# Patient Record
Sex: Male | Born: 1965 | Race: White | Hispanic: No | Marital: Married | Smoking: Never smoker
Health system: Southern US, Community
[De-identification: ages and names within clinical notes are randomized; demographics above are authoritative.]

## PROBLEM LIST (undated history)

## (undated) DIAGNOSIS — J309 Allergic rhinitis, unspecified: Secondary | ICD-10-CM

## (undated) DIAGNOSIS — Z9989 Dependence on other enabling machines and devices: Secondary | ICD-10-CM

## (undated) DIAGNOSIS — R7989 Other specified abnormal findings of blood chemistry: Secondary | ICD-10-CM

## (undated) DIAGNOSIS — E7800 Pure hypercholesterolemia, unspecified: Secondary | ICD-10-CM

## (undated) DIAGNOSIS — G4733 Obstructive sleep apnea (adult) (pediatric): Secondary | ICD-10-CM

## (undated) DIAGNOSIS — K5790 Diverticulosis of intestine, part unspecified, without perforation or abscess without bleeding: Secondary | ICD-10-CM

## (undated) HISTORY — DX: Obstructive sleep apnea (adult) (pediatric): G47.33

## (undated) HISTORY — DX: Other specified abnormal findings of blood chemistry: R79.89

## (undated) HISTORY — PX: HAND SURGERY: SHX662

## (undated) HISTORY — DX: Dependence on other enabling machines and devices: Z99.89

## (undated) HISTORY — DX: Allergic rhinitis, unspecified: J30.9

## (undated) HISTORY — PX: TONSILLECTOMY: SUR1361

---

## 2005-05-02 ENCOUNTER — Observation Stay (HOSPITAL_COMMUNITY): Admission: EM | Admit: 2005-05-02 | Discharge: 2005-05-02 | Payer: Self-pay | Admitting: Emergency Medicine

## 2005-09-24 ENCOUNTER — Ambulatory Visit (HOSPITAL_COMMUNITY): Admission: RE | Admit: 2005-09-24 | Discharge: 2005-09-24 | Payer: Self-pay | Admitting: Gastroenterology

## 2009-12-28 ENCOUNTER — Ambulatory Visit (HOSPITAL_BASED_OUTPATIENT_CLINIC_OR_DEPARTMENT_OTHER): Admission: RE | Admit: 2009-12-28 | Discharge: 2009-12-28 | Payer: Self-pay | Admitting: Otolaryngology

## 2010-01-04 ENCOUNTER — Ambulatory Visit: Payer: Self-pay | Admitting: Internal Medicine

## 2011-04-16 NOTE — Consult Note (Signed)
NAMESAHEJ, [name]                ACCOUNT NO.:  [id number]   MEDICAL RECORD NO.:  [id number]          PATIENT TYPE:  INP   LOCATION:  1830                         FACILITY:  [facility]   PHYSICIAN:  Artist Pais. Weingold, M.D.DATE OF BIRTH:  [birth year]   DATE OF CONSULTATION:  05/02/2005  DATE OF DISCHARGE:                                   CONSULTATION   REQUESTING PHYSICIAN:  Markham Jordan L. Effie Shy, M.D.   REASON FOR CONSULTATION:  Jeffery Branch is a 45 year old right-handed  dominant male who got his nondominant left hand caught in a hedge trimmer  and presents today with obvious grossly contaminated open wound to the  dorsal aspect of his long and index finger nondominant left hand.  He is 45  years old.  He has no known drug allergies, no current medications, no  recent hospitalizations or surgery.   FAMILY HISTORY:  Noncontributory.   SOCIAL HISTORY:  Noncontributory.   PHYSICAL EXAMINATION:  GENERAL:  Well-developed, well-nourished male,  pleasant, alert and oriented times three.  EXTREMITIES:  Examination of his left hand reveals an obvious open injury  times two, one over the index proximal interphalangeal joint, one over the  long finger dorsally from the proximal interphalangeal joint all the way to  the nail plate.  He has weakness in extension. Volarly he is intact.  He has  exposed middle phalangeal bone on the long finger and exposed proximal  interphalangeal joint on his index finger.   X-rays show a fracture of the middle phalanx on his long finger.   IMPRESSION:  A 45 year old male, right hand dominant with a grossly  contaminated complex injury to the dorsal aspect of his nondominant left  index and long finger.   RECOMMENDATIONS:  He needs to go to the operating room for irrigation and  debridement and repair as necessary, left index and long finger.       MAW/MEDQ  D:  05/02/2005  T:  05/03/2005  Job:  [id number]   cc:   Markham Jordan L. Effie Shy, M.D.  [address]  [place] [address]  Fax: [phone]

## 2011-04-16 NOTE — Op Note (Signed)
NAMEEPIFANIO, LABRADOR                ACCOUNT NO.:  [id number]   MEDICAL RECORD NO.:  [id number]          PATIENT TYPE:  AMB   LOCATION:  ENDO                         FACILITY:  [facility]   PHYSICIAN:  John C. Madilyn Fireman, M.D.    DATE OF BIRTH:  06/24/1966   DATE OF PROCEDURE:  09/24/2005  DATE OF DISCHARGE:                                 OPERATIVE REPORT   PROCEDURE:  Colonoscopy.   INDICATIONS FOR PROCEDURE:  Family history of colon polyps in two first-  degree relatives as well as intermittent rectal bleeding.   PROCEDURE:  The patient was placed in the left lateral decubitus position  and placed on the pulse monitor with continuous low-flow oxygen delivered by  nasal cannula.  He was sedated with 75 mcg IV fentanyl and 9 mg IV Versed.  The Olympus video colonoscope was inserted into the rectum and advanced to  the cecum, confirmed by transillumination at McBurney's point and  visualization of the ileocecal valve and appendiceal orifice.  The prep was  excellent.  The cecum, ascending, transverse, and descending colon all  appeared normal with no masses, polyps, diverticula, or other mucosal  abnormalities.  Within the descending and sigmoid colon, there was seen a  few scattered diverticula and no other abnormalities.  The rectum appeared  normal, and retroflexed view of the anus revealed no obvious internal  hemorrhoids.  The scope was then withdrawn, and the patient returned to the  recovery room in stable condition.  He tolerated the procedure well, and  there were no immediate complications.   IMPRESSION:  Diverticulosis, otherwise normal study.   PLAN:  Repeat colonoscopy in 10 years at age 70.           ______________________________  Everardo All. Madilyn Fireman, M.D.     JCH/MEDQ  D:  09/24/2005  T:  09/24/2005  Job:  [id number]   cc:   Sigmund Hazel, M.D.  Fax: [phone]

## 2011-04-16 NOTE — Op Note (Signed)
Jeffery Branch, Jeffery Branch                ACCOUNT NO.:  [id number]   MEDICAL RECORD NO.:  [id number]          PATIENT TYPE:  INP   LOCATION:  1830                         FACILITY:  [facility]   PHYSICIAN:  Artist Pais. Weingold, M.D.DATE OF BIRTH:  1966/02/05   DATE OF PROCEDURE:  05/02/2005  DATE OF DISCHARGE:                                 OPERATIVE REPORT   PREOPERATIVE DIAGNOSIS:  Left index and left long open injuries.   POSTOPERATIVE DIAGNOSIS:  Left index and left long open injuries.   OPERATION PERFORMED:  1.  Left long finger irrigation and debridement and open treatment middle      phalangeal fracture, extensor tendon repair.  2.  Irrigation and debridement and repair of extensor tendon, left index      finger.   SURGEON:  Artist Pais. Mina Marble, M.D.   ASSISTANT:  None.   ANESTHESIA:  General.   TOURNIQUET TIME:  48 minutes.   COMPLICATIONS:  None.   DRAINS:  None.   DESCRIPTION OF PROCEDURE:  The patient was taken to the operating room and  after the induction of adequate general anesthesia was prepped and draped in  the usual sterile fashion.  An Esmarch was used to exsanguinate the limb.  The tourniquet was inflated to 250 mmHg.  At this point the left hand was  approached dorsally.  There was gross contamination of the long and index  fingers with severely complicated lacerations.  These were debrided of gross  contamination including dirt and grass with 2 L of normal saline.  At this  point the open fracture of the middle phalanx and long finger was debrided  and then the extensor tendon was repaired using 4-0 Ethibond in combination  of horizontal mattress and locked epitendinous sutures.  This included the  distal insertion and the area of the triangular ligament over the PIP joint.  The skin was then carefully reconstructed using 4-0 nylon in multiple simple  sutures to cover the extensor tendon and fracture site.  The index finger  PIP joint was then opened,  irrigated and  extensor tendon over the PIP joint was repaired with 4-0 nylon.  At this  point sterile dressings with Xeroform, 4 x 4s, compression wrap and a volar  splint with MP, PIP and DIP joints in extension of the index and long finger  of the left hand was applied.  The patient tolerated the procedure well and  went to recovery room in stable fashion.       MAW/MEDQ  D:  05/02/2005  T:  05/03/2005  Job:  [id number]

## 2011-05-13 ENCOUNTER — Emergency Department (HOSPITAL_COMMUNITY)
Admission: EM | Admit: 2011-05-13 | Discharge: 2011-05-13 | Disposition: A | Discharge status: Home / Self Care | Payer: No Typology Code available for payment source | Attending: Emergency Medicine | Admitting: Emergency Medicine

## 2011-05-13 ENCOUNTER — Emergency Department (HOSPITAL_COMMUNITY): Payer: No Typology Code available for payment source

## 2011-05-13 DIAGNOSIS — M545 Low back pain, unspecified: Secondary | ICD-10-CM | POA: Insufficient documentation

## 2011-05-13 DIAGNOSIS — Z79899 Other long term (current) drug therapy: Secondary | ICD-10-CM | POA: Insufficient documentation

## 2011-05-13 DIAGNOSIS — R209 Unspecified disturbances of skin sensation: Secondary | ICD-10-CM | POA: Insufficient documentation

## 2011-05-13 DIAGNOSIS — E785 Hyperlipidemia, unspecified: Secondary | ICD-10-CM | POA: Insufficient documentation

## 2011-05-13 DIAGNOSIS — R51 Headache: Secondary | ICD-10-CM | POA: Insufficient documentation

## 2011-05-13 DIAGNOSIS — M542 Cervicalgia: Secondary | ICD-10-CM | POA: Insufficient documentation

## 2011-05-13 DIAGNOSIS — H538 Other visual disturbances: Secondary | ICD-10-CM | POA: Insufficient documentation

## 2013-08-14 ENCOUNTER — Ambulatory Visit (INDEPENDENT_AMBULATORY_CARE_PROVIDER_SITE_OTHER): Payer: Managed Care, Other (non HMO) | Admitting: General Surgery

## 2014-03-20 ENCOUNTER — Encounter (INDEPENDENT_AMBULATORY_CARE_PROVIDER_SITE_OTHER): Payer: Self-pay

## 2016-07-17 ENCOUNTER — Encounter (HOSPITAL_COMMUNITY): Payer: Self-pay

## 2016-07-17 ENCOUNTER — Emergency Department (HOSPITAL_COMMUNITY): Payer: Managed Care, Other (non HMO)

## 2016-07-17 ENCOUNTER — Emergency Department (HOSPITAL_COMMUNITY)
Admission: EM | Admit: 2016-07-17 | Discharge: 2016-07-17 | Disposition: A | Discharge status: Home / Self Care | Payer: Managed Care, Other (non HMO) | Attending: Emergency Medicine | Admitting: Emergency Medicine

## 2016-07-17 DIAGNOSIS — R1031 Right lower quadrant pain: Secondary | ICD-10-CM | POA: Diagnosis present

## 2016-07-17 DIAGNOSIS — Z79899 Other long term (current) drug therapy: Secondary | ICD-10-CM | POA: Diagnosis not present

## 2016-07-17 DIAGNOSIS — K5790 Diverticulosis of intestine, part unspecified, without perforation or abscess without bleeding: Secondary | ICD-10-CM | POA: Diagnosis not present

## 2016-07-17 DIAGNOSIS — K5793 Diverticulitis of intestine, part unspecified, without perforation or abscess with bleeding: Secondary | ICD-10-CM

## 2016-07-17 HISTORY — DX: Pure hypercholesterolemia, unspecified: E78.00

## 2016-07-17 HISTORY — DX: Diverticulosis of intestine, part unspecified, without perforation or abscess without bleeding: K57.90

## 2016-07-17 LAB — URINALYSIS, ROUTINE W REFLEX MICROSCOPIC
BILIRUBIN URINE: NEGATIVE
Glucose, UA: NEGATIVE mg/dL
Hgb urine dipstick: NEGATIVE
KETONES UR: NEGATIVE mg/dL
Leukocytes, UA: NEGATIVE
NITRITE: NEGATIVE
PROTEIN: NEGATIVE mg/dL
SPECIFIC GRAVITY, URINE: 1.027 (ref 1.005–1.030)
pH: 6 (ref 5.0–8.0)

## 2016-07-17 LAB — CBC WITH DIFFERENTIAL/PLATELET
Basophils Absolute: 0 10*3/uL (ref 0.0–0.1)
Basophils Relative: 0 %
EOS PCT: 0 %
Eosinophils Absolute: 0 10*3/uL (ref 0.0–0.7)
HCT: 43.6 % (ref 39.0–52.0)
Hemoglobin: 15.1 g/dL (ref 13.0–17.0)
LYMPHS ABS: 1.2 10*3/uL (ref 0.7–4.0)
LYMPHS PCT: 9 %
MCH: 29.8 pg (ref 26.0–34.0)
MCHC: 34.6 g/dL (ref 30.0–36.0)
MCV: 86.2 fL (ref 78.0–100.0)
MONO ABS: 1.1 10*3/uL — AB (ref 0.1–1.0)
MONOS PCT: 9 %
NEUTROS PCT: 82 %
Neutro Abs: 10.4 10*3/uL — ABNORMAL HIGH (ref 1.7–7.7)
PLATELETS: 210 10*3/uL (ref 150–400)
RBC: 5.06 MIL/uL (ref 4.22–5.81)
RDW: 13.2 % (ref 11.5–15.5)
WBC: 12.7 10*3/uL — AB (ref 4.0–10.5)

## 2016-07-17 LAB — COMPREHENSIVE METABOLIC PANEL
ALT: 20 U/L (ref 17–63)
AST: 16 U/L (ref 15–41)
Albumin: 4.3 g/dL (ref 3.5–5.0)
Alkaline Phosphatase: 52 U/L (ref 38–126)
Anion gap: 9 (ref 5–15)
BUN: 16 mg/dL (ref 6–20)
CALCIUM: 9.3 mg/dL (ref 8.9–10.3)
CHLORIDE: 107 mmol/L (ref 101–111)
CO2: 23 mmol/L (ref 22–32)
CREATININE: 0.94 mg/dL (ref 0.61–1.24)
Glucose, Bld: 107 mg/dL — ABNORMAL HIGH (ref 65–99)
Potassium: 3.9 mmol/L (ref 3.5–5.1)
Sodium: 139 mmol/L (ref 135–145)
Total Bilirubin: 1.2 mg/dL (ref 0.3–1.2)
Total Protein: 7.8 g/dL (ref 6.5–8.1)

## 2016-07-17 LAB — LIPASE, BLOOD: LIPASE: 23 U/L (ref 11–51)

## 2016-07-17 MED ORDER — IOPAMIDOL (ISOVUE-300) INJECTION 61%
100.0000 mL | Freq: Once | INTRAVENOUS | Status: AC | PRN
  Administered 2016-07-17: 100 mL via INTRAVENOUS

## 2016-07-17 MED ORDER — METRONIDAZOLE 500 MG PO TABS: 500.0000 mg | ORAL_TABLET | Freq: Three times a day (TID) | ORAL | 0 refills | Status: DC

## 2016-07-17 MED ORDER — SODIUM CHLORIDE 0.9 % IV BOLUS (SEPSIS)
1000.0000 mL | Freq: Once | INTRAVENOUS | Status: AC
  Administered 2016-07-17: 1000 mL via INTRAVENOUS

## 2016-07-17 MED ORDER — METRONIDAZOLE 500 MG PO TABS
500.0000 mg | ORAL_TABLET | Freq: Once | ORAL | Status: AC
  Administered 2016-07-17: 500 mg via ORAL
  Filled 2016-07-17: qty 1

## 2016-07-17 MED ORDER — CIPROFLOXACIN HCL 500 MG PO TABS: 500.0000 mg | ORAL_TABLET | Freq: Two times a day (BID) | ORAL | 0 refills | Status: DC

## 2016-07-17 MED ORDER — CIPROFLOXACIN HCL 500 MG PO TABS
500.0000 mg | ORAL_TABLET | Freq: Once | ORAL | Status: AC
  Administered 2016-07-17: 500 mg via ORAL
  Filled 2016-07-17: qty 1

## 2016-07-17 MED ORDER — ONDANSETRON HCL 4 MG/2ML IJ SOLN
4.0000 mg | Freq: Once | INTRAMUSCULAR | Status: AC
  Administered 2016-07-17: 4 mg via INTRAVENOUS
  Filled 2016-07-17: qty 2

## 2016-07-17 NOTE — ED Notes (Signed)
Pt vitals taken while pt expressing frustration.

## 2016-07-17 NOTE — Discharge Instructions (Signed)
Take cipro and flagyl as prescribed.   Take tylenol, motrin for pain.   See your doctor.   Return to ER if you have severe pain, vomiting, fevers.

## 2016-07-17 NOTE — ED Triage Notes (Addendum)
PT C/O RLQ PAIN WITH NAUSEA, AND COLD SWEATS SINCE TUE. PT WAS SEEN BY HIS PCP TODAY, AND SENT HERE FOR POSSIBLE APPENDICITIS.

## 2016-07-17 NOTE — ED Provider Notes (Signed)
[facility]-EMERGENCY DEPT Provider Note   CSN: [id number] Arrival date & time: 07/17/16  1238     History   Chief Complaint Chief Complaint  Patient presents with  . Abdominal Pain    RLQ    HPI Jeffery KeelerRees Gatt is a 50 y.o. male history of diverticulitis here presenting with right lower quadrant pain. Intermittent periumbilical and right lower quadrant pain For the last 4 days. Yesterday he became more nauseated and had a subjective fever. Patient denies any urinary symptoms or diarrhea or constipation. Patient went to see primary care doctor today and sent here for r/o appendicitis. Hx of diverticulitis previously on colonoscopy.    The history is provided by the patient.    Past Medical History:  Diagnosis Date  . Diverticulitis   . Diverticulosis   . High cholesterol     There are no active problems to display for this patient.   Past Surgical History:  Procedure Laterality Date  . HAND SURGERY     LEFT  . TONSILLECTOMY         Home Medications    Prior to Admission medications   Medication Sig Start Date End Date Taking? Authorizing Provider  ibuprofen (ADVIL,MOTRIN) 200 MG tablet Take 800 mg by mouth every 6 (six) hours as needed for fever.   Yes Historical Provider, MD  venlafaxine XR (EFFEXOR-XR) 75 MG 24 hr capsule TAKE ONE CAPSULE BY MOUTH DAILY WITH FOOD 06/16/16  Yes Historical Provider, MD  VIAGRA 100 MG tablet TAKE 1/2-1 TABLET BY MOUTH AS NEEDED ONCE A DAY 06/28/16  Yes Historical Provider, MD    Family History History reviewed. No pertinent family history.  Social History Social History  Substance Use Topics  . Smoking status: Never Smoker  . Smokeless tobacco: Never Used  . Alcohol use Yes     Comment: OCCASIONAL     Allergies   Penicillins   Review of Systems Review of Systems  Gastrointestinal: Positive for abdominal pain.  All other systems reviewed and are negative.    Physical Exam Updated Vital Signs BP 128/72 (BP Location: Right  Arm)   Pulse 81   Temp 98.7 F (37.1 C) (Oral)   Resp 16   Ht 6\' 1"  (1.854 m)   Wt 233 lb (105.7 kg)   SpO2 100%   BMI 30.74 kg/m   Physical Exam  Constitutional: He is oriented to person, place, and time. He appears well-developed and well-nourished.  HENT:  Head: Normocephalic.  Eyes: EOM are normal. Pupils are equal, round, and reactive to light.  Neck: Normal range of motion.  Cardiovascular: Normal rate, regular rhythm and normal heart sounds.   Pulmonary/Chest: Effort normal and breath sounds normal. No respiratory distress. He has no wheezes. He has no rales.  Abdominal: Soft. Bowel sounds are normal.  Mild RLQ and periumbilical tenderness, mild guarding   Genitourinary:  Genitourinary Comments: No testicular tenderness, good cremasteric reflex   Musculoskeletal: Normal range of motion.  Neurological: He is alert and oriented to person, place, and time.  Skin: Skin is warm.  Psychiatric: He has a normal mood and affect.  Nursing note and vitals reviewed.    ED Treatments / Results  Labs (all labs ordered are listed, but only abnormal results are displayed) Labs Reviewed  CBC WITH DIFFERENTIAL/PLATELET - Abnormal; Notable for the following:       Result Value   WBC 12.7 (*)    Neutro Abs 10.4 (*)    Monocytes Absolute 1.1 (*)  All other components within normal limits  COMPREHENSIVE METABOLIC PANEL - Abnormal; Notable for the following:    Glucose, Bld 107 (*)    All other components within normal limits  URINALYSIS, ROUTINE W REFLEX MICROSCOPIC (NOT AT [facility]) - Abnormal; Notable for the following:    Color, Urine AMBER (*)    All other components within normal limits  LIPASE, BLOOD    EKG  EKG Interpretation None       Radiology Ct Abdomen Pelvis W Contrast  Result Date: 07/17/2016 CLINICAL DATA:  History of diverticulitis and diverticulosis.  Pain. EXAM: CT ABDOMEN AND PELVIS WITH CONTRAST TECHNIQUE: Multidetector CT imaging of the abdomen and  pelvis was performed using the standard protocol following bolus administration of intravenous contrast. CONTRAST:  ISOVUE-300 IOPAMIDOL (ISOVUE-300) INJECTION 61% COMPARISON:  None. FINDINGS: Normal lung bases. No free air in the abdomen. The rectum is normal in appearance. There is wall thickening associated with the distal half of the sigmoid colon with adjacent stranding and a small amount of adjacent fluid. No evidence of extraluminal gas or perforation. The patient does have diverticuli. There are a few diverticuli in the affected portion of the sigmoid colon but the relatively long segment of abnormal sigmoid colon with wall thickening and adjacent stranding does not appear to be centered around a specific diverticulum. There is inflammation around the multiple diverticuli but this could be secondary rather than causative. No pneumatosis. The diverticuli throughout the remainder of the colon demonstrate no associated inflammation. The appendix is well seen with no appendicitis. The stomach is normal. Fat stranding abuts the ileum but does not appear to arise from the ileum. No definitive ileal wall thickening or inflammation on this study. Evaluation is somewhat limited due to lack of distention. There is hepatic steatosis. A geographic region of increased attenuation in the left hepatic lobe is probably focal fatty sparing and does not have the appearance of a suspicious mass. The remainder of the liver is normal. The gallbladder, portal vein, spleen, pancreas, adrenal glands, kidneys, and ureters are normal. The abdominal aorta is normal in caliber with no atherosclerosis. No adenopathy. No adenopathy or mass in the pelvis. Abnormal sigmoid colon as above. The prostate and seminal vesicles are normal. The wall of bladder is prominent but this could be due to lack of distention. Degenerative changes in the spine with no acute bony abnormalities. Delayed images through the upper abdomen demonstrate no  filling defects in the upper renal collecting systems. There is a fat containing periumbilical hernia and a fat containing left inguinal hernia. IMPRESSION: 1. Abnormal wall thickening and adjacent pericolonic stranding associated with the distal half of the sigmoid colon. All diverticulitis is a possibility, especially given history, the length of sigmoid colon affected is longer than typical for diverticulitis can be inflammation does not appear to arise or re- centered around a particular diverticulum. Other causes of colitis, which could be infectious or inflammatory, should be considered. The patient may benefit from a colonoscopy after resolution of symptoms. 2. No other acute abnormalities. Electronically Signed   By: Gerome Sam III M.D   On: 07/17/2016 15:41    Procedures Procedures (including critical care time)  Medications Ordered in ED Medications  ciprofloxacin (CIPRO) tablet 500 mg (not administered)  metroNIDAZOLE (FLAGYL) tablet 500 mg (not administered)  sodium chloride 0.9 % bolus 1,000 mL (1,000 mLs Intravenous New Bag/Given 07/17/16 1349)  ondansetron (ZOFRAN) injection 4 mg (4 mg Intravenous Given 07/17/16 1348)  iopamidol (ISOVUE-300) 61 %  injection 100 mL (100 mLs Intravenous Contrast Given 07/17/16 1441)     Initial Impression / Assessment and Plan / ED Course  I have reviewed the triage vital signs and the nursing notes.  Pertinent labs & imaging results that were available during my care of the patient were reviewed by me and considered in my medical decision making (see chart for details).  Clinical Course    Jeffery Branch is a 50 y.o. male here with RLQ pain. Testicles nontender. Subjective fever and nausea since yesterday. Concerned for possible appy vs diverticulitis. Will get labs, CT ab/pel, UA.   4:34 PM CT showed diverticulitis with no abscess. WBC 13. Appendix nl. Will dc home with cipro, flagyl.    Final Clinical Impressions(s) / ED Diagnoses    Final diagnoses:  None    New Prescriptions New Prescriptions   No medications on file     Charlynne Panderavid Hsienta Peityn Payton, MD 07/17/16 1635

## 2017-01-19 ENCOUNTER — Telehealth: Payer: Self-pay

## 2017-01-19 NOTE — Telephone Encounter (Signed)
Sent notes to scheduling 

## 2017-02-20 NOTE — Progress Notes (Deleted)
Cardiology Office Note   Date:  02/20/2017   ID:  Jeffery Keelerees Baack, DOB 1965/12/22, MRN [id number]  PCP:  No primary care provider on file.  Cardiologist:   Rollene RotundaJames Callista Hoh, MD  Referring:  ***  No chief complaint on file.     History of Present Illness: Jeffery Branch is a 51 y.o. male who presents for ***    Past Medical History:  Diagnosis Date  . Diverticulitis   . Diverticulosis   . High cholesterol     Past Surgical History:  Procedure Laterality Date  . HAND SURGERY     LEFT  . TONSILLECTOMY       Current Outpatient Prescriptions  Medication Sig Dispense Refill  . ciprofloxacin (CIPRO) 500 MG tablet Take 1 tablet (500 mg total) by mouth 2 (two) times daily. One po bid x 7 days 14 tablet 0  . ibuprofen (ADVIL,MOTRIN) 200 MG tablet Take 800 mg by mouth every 6 (six) hours as needed for fever.    . metroNIDAZOLE (FLAGYL) 500 MG tablet Take 1 tablet (500 mg total) by mouth 3 (three) times daily. One po bid x 7 days 21 tablet 0  . venlafaxine XR (EFFEXOR-XR) 75 MG 24 hr capsule TAKE ONE CAPSULE BY MOUTH DAILY WITH FOOD  0  . VIAGRA 100 MG tablet TAKE 1/2-1 TABLET BY MOUTH AS NEEDED ONCE A DAY  0   No current facility-administered medications for this visit.     Allergies:   Penicillins    Social History:  The patient  reports that he has never smoked. He has never used smokeless tobacco. He reports that he drinks alcohol. He reports that he does not use drugs.   Family History:  The patient's ***family history is not on file.    ROS:  Please see the history of present illness.   Otherwise, review of systems are positive for {NONE DEFAULTED:18576::"none"}.   All other systems are reviewed and negative.    PHYSICAL EXAM: VS:  There were no vitals taken for this visit. , BMI There is no height or weight on file to calculate BMI. GENERAL:  Well appearing HEENT:  Pupils equal round and reactive, fundi not visualized, oral mucosa unremarkable NECK:  No jugular  venous distention, waveform within normal limits, carotid upstroke brisk and symmetric, no bruits, no thyromegaly LYMPHATICS:  No cervical, inguinal adenopathy LUNGS:  Clear to auscultation bilaterally BACK:  No CVA tenderness CHEST:  Unremarkable HEART:  PMI not displaced or sustained,S1 and S2 within normal limits, no S3, no S4, no clicks, no rubs, *** murmurs ABD:  Flat, positive bowel sounds normal in frequency in pitch, no bruits, no rebound, no guarding, no midline pulsatile mass, no hepatomegaly, no splenomegaly EXT:  2 plus pulses throughout, no edema, no cyanosis no clubbing SKIN:  No rashes no nodules NEURO:  Cranial nerves II through XII grossly intact, motor grossly intact throughout PSYCH:  Cognitively intact, oriented to person place and time    EKG:  EKG {ACTION; IS/IS [id number]:[id number]}OT:[id number]} ordered today. The ekg ordered today demonstrates ***   Recent Labs: 07/17/2016: ALT 20; BUN 16; Creatinine, Ser 0.94; Hemoglobin 15.1; Platelets 210; Potassium 3.9; Sodium 139    Lipid Panel No results found for: CHOL, TRIG, HDL, CHOLHDL, VLDL, LDLCALC, LDLDIRECT    Wt Readings from Last 3 Encounters:  07/17/16 233 lb (105.7 kg)      Other studies Reviewed: Additional studies/ records that were reviewed today include: ***. Review of the above records demonstrates:  Please see elsewhere in the note.  ***   ASSESSMENT AND PLAN:  ***   Current medicines are reviewed at length with the patient today.  The patient {ACTIONS; HAS/DOES NOT HAVE:19233} concerns regarding medicines.  The following changes have been made:  {PLAN; NO CHANGE:13088:s}  Labs/ tests ordered today include: *** No orders of the defined types were placed in this encounter.    Disposition:   FU with ***    Signed, Rollene Rotunda, MD  02/20/2017 6:17 PM    [facility]

## 2017-02-21 ENCOUNTER — Ambulatory Visit: Payer: Managed Care, Other (non HMO) | Admitting: Cardiology

## 2017-02-21 ENCOUNTER — Encounter: Payer: Self-pay | Admitting: *Deleted

## 2017-05-04 ENCOUNTER — Ambulatory Visit: Payer: Managed Care, Other (non HMO) | Admitting: Cardiology

## 2017-05-17 NOTE — Progress Notes (Signed)
Recent Labs: 12/2014 Na+ 139, K+ 4.4, Cl- 107, HCO3- 27 , BUN 15, Cr 1.02, Glu 106, Ca2+ 9.9; AST 16, ALT, 25 AlkP 45 TC 247, TG 440, HDL 32, LDL unable to calculate  Bryan Lemmaavid Harding, MD

## 2017-05-20 ENCOUNTER — Encounter: Payer: Self-pay | Admitting: *Deleted

## 2017-05-23 ENCOUNTER — Encounter: Payer: Self-pay | Admitting: Cardiology

## 2017-05-23 ENCOUNTER — Ambulatory Visit (INDEPENDENT_AMBULATORY_CARE_PROVIDER_SITE_OTHER): Payer: Managed Care, Other (non HMO) | Admitting: Cardiology

## 2017-05-23 DIAGNOSIS — E785 Hyperlipidemia, unspecified: Secondary | ICD-10-CM | POA: Diagnosis not present

## 2017-05-23 DIAGNOSIS — R079 Chest pain, unspecified: Secondary | ICD-10-CM | POA: Diagnosis not present

## 2017-05-23 NOTE — Patient Instructions (Signed)
SCHEDULE AT [address] AT [address] Your physician has requested that you have an exercise tolerance test. For further information please visit [email or web address]. Please also follow instruction sheet, as given.     IF TEST IS NORMAL , NO FOLLOW UP IS NEEDED, IF ABNORMAL APPOINTMENT WILL BE MADE TO DISCUSS RESULTS.

## 2017-05-23 NOTE — Progress Notes (Signed)
PCP: Sigmund Hazel, MD  Clinic Note: Chief Complaint  Patient presents with  . New Evaluation    Chest pain, shortness of breath     HPI:  Jeffery Branch is a 51 y.o. male who is being seen today for the evaluation of chest pain at the request of Marcelo Baldy, PA-C. His past medical history of hyperlipidemia, obesity, OSA on CPAP as well as "slight heart murmur "  Jeffery Branch was last seen in February this year after ER visit when out of the hospital for chest pain he was noticing numbness in the right side off and on as well as left arm. It pulling pain. He says currently had some chest pain "near his heart". 2 episodes that day (was in [place] for work)- was actually at a hospital working. He decided to go the emergency room. Was ruled out for MI as well as PE, and decided to go home with plan for outpatient evaluation -> the EDP at the hospital did recommend cardiology evaluation for possible stress test "possible old heartattack". Unfortunately this did not happen in a timely manner.  Recent Hospitalizations: None  Studies Personally Reviewed - (if available, images/films reviewed: From Epic Chart or Care Everywhere)  None  Interval History: Jeffery Branch presents here today really not having had any more symptoms since that episode [place]. Basically back in early February he was at work and started noticing off and on right leg numbness and discomfort that lasted off and on for a few hours and then he started having discomfort in his left arm on the bicep area with An aching sensation that was followed by a sharp cramping pain in the left side of his chest. The pain was bad enough that his breath away. 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  He then followed back up with Marcelo Baldy, PA-C @ [facility] on Feb 19 for OP f/u with PCP.  Based on that visit, the referral was initially placed for cardiology follow-up, and he finally is here now. Since that visit to the emergency room, he has not had any further episodes similar to that day. No further chest pain or dyspnea with rest or exertion. He walks a lot at work - and with traveling at work and walks at least 2 miles most days. When he has the time he enjoys riding his bike for at least 25 miles a week and plays vascular with her son. 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  Remainder of his cardiovascular review of symptoms is negative as follows: No PND, orthopnea or edema. No palpitations, lightheadedness, dizziness, weakness or syncope/near syncope. No TIA/amaurosis fugax symptoms. No melena, hematochezia, hematuria, or epstaxis. No claudication.  ROS: A comprehensive was performed. Review of Systems  Constitutional: Negative for malaise/fatigue.  HENT: Negative for congestion and nosebleeds.   Respiratory: Negative for cough, shortness of breath and wheezing.        He has OSA, and uses CPAP religiously. However sometimes during the day he will have an apnea episode  Musculoskeletal: Negative for falls and joint pain.  Neurological: Negative for dizziness.  Endo/Heme/Allergies: Negative for environmental allergies.  Psychiatric/Behavioral: The patient is not nervous/anxious and does not have insomnia.   All other systems reviewed and are negative.  I have reviewed and (if needed) personally updated the patient's problem list, medications, allergies, past medical and surgical history, social and family history.   Past Medical History:  Diagnosis Date  . Allergic rhinitis   . Diverticulosis    with intermittent Diverticulitis; ? IBS history  . High cholesterol    not currently on meds    . Low testosterone in male   . OSA on CPAP     Past Surgical History:  Procedure Laterality Date  . HAND SURGERY     LEFT  . TONSILLECTOMY      Current Meds  Medication Sig  . ibuprofen (ADVIL,MOTRIN) 200 MG tablet Take 800 mg by mouth every 6 (six) hours as needed for fever.  . venlafaxine XR (EFFEXOR-XR) 75 MG 24 hr capsule TAKE ONE CAPSULE BY MOUTH DAILY WITH FOOD  . VIAGRA 100 MG tablet TAKE 1/2-1 TABLET BY MOUTH AS NEEDED ONCE A DAY    Allergies  Allergen Reactions  . Penicillins Anaphylaxis    Has patient had a PCN reaction causing immediate rash, facial/tongue/throat swelling, SOB or lightheadedness with hypotension: Yes Has patient had a PCN reaction causing severe rash involving mucus membranes or skin necrosis: Yes Has patient had a PCN reaction that required hospitalization Yes Has patient had a PCN reaction occurring within the last 10 years: No If all of the above answers are "NO", then may proceed with Cephalosporin use.    Social History   Social History  . Marital status: Single    Spouse name: N/A  . Number of children: N/A  . Years of education: N/A   Social History Main Topics  . Smoking status: Never Smoker  . Smokeless tobacco: Never Used  . Alcohol use Yes     Comment: OCCASIONAL  . Drug use: No  . Sexual activity: Not Asked   Other Topics Concern  . None   Social History Narrative   He works as a [employer] for [employer] - he routinely travels between [place] to [place].   Very active - rides bike ~25 miles / week.   Walks a lot @ work & otherwise tries to walk ~2 miles daily.    family history includes Colon polyps in his brother and father.  Wt Readings from Last 3 Encounters:  05/23/17 235 lb (106.6 kg)  07/17/16 233 lb (105.7 kg)    PHYSICAL EXAM BP 122/86   Pulse 65   Ht 6\' 1"  (1.854 m)   Wt 235 lb (106.6 kg)   BMI 31.00 kg/m  General appearance: alert, cooperative, appears stated age, no distress. mildy obese  - well-nourished, well-groomed HEENT: [place]/AT, EOMI, MMM, anicteric sclera Neck: no adenopathy, no carotid bruit and no JVD Lungs: clear to auscultation bilaterally, normal percussion bilaterally and non-labored Heart: regular rate and rhythm, S1 &S2 normal, no murmur, click, rub or gallop; nondisplaced PMI Abdomen: soft, non-tender; bowel sounds normal; no masses,  no organomegaly; no HJR Extremities: extremities normal, atraumatic, no cyanosis, or edema  Pulses: 2+ and symmetric;  Skin: mobility and turgor normal, no edema, no evidence of bleeding or bruising and no lesions noted or  Neurologic: Mental status: Alert & oriented x 3, thought content appropriate; non-focal exam.  Pleasant mood & affect. Cranial nerves: normal (II-XII grossly intact)    Adult ECG Report  Rate: 65 ;  Rhythm: normal sinus  rhythm; normal axis, intervals and durations.  Narrative Interpretation: Normal EKG: No change from EKG on 01/18/2017  Other studies Reviewed: Additional studies/ records that were reviewed today include:  Recent Labs:  n/a   ASSESSMENT / PLAN: Problem List Items Addressed This Visit    Chest pain with low risk for cardiac etiology    Relatively atypical symptoms for angina with no further symptoms since February. Because he is very active, and would like to confirm that he is OK to get back on the bike & play ball with his son.  Plan: Exercise TM Stress Test (Graded Exercise Tolerance Test) - ETT/GXT      Relevant Orders   EXERCISE TOLERANCE TEST   EKG 12-Lead (Completed)   Dyslipidemia (high LDL; low HDL) (Chronic)    Not currently on medications. Does be monitored by PCP.      Relevant Orders   EXERCISE TOLERANCE TEST   EKG 12-Lead (Completed)      Current medicines are reviewed at length with the patient today. (+/- concerns) n/a The following changes have been made: n/a  Patient Instructions  SCHEDULE AT [address] AT [address] Your physician has requested that you  have an exercise tolerance test. For further information please visit [email or web address] Please also follow instruction sheet, as given.     IF TEST IS NORMAL , NO FOLLOW UP IS NEEDED, IF ABNORMAL APPOINTMENT WILL BE MADE TO DISCUSS RESULTS.       Studies Ordered:   Orders Placed This Encounter  Procedures  . EXERCISE TOLERANCE TEST  . EKG 12-Lead      Bryan Lemma, M.D., M.S. Interventional Cardiologist   Pager # [phone] Phone # [phone] [address] [place], [place] [address]

## 2017-05-25 ENCOUNTER — Encounter: Payer: Self-pay | Admitting: Cardiology

## 2017-05-25 NOTE — Assessment & Plan Note (Addendum)
Relatively atypical symptoms for angina with no further symptoms since February. Because he is very active, and would like to confirm that he is OK to get back on the bike & play ball with his son.  Plan: Exercise TM Stress Test (Graded Exercise Tolerance Test) - ETT/GXT

## 2017-05-25 NOTE — Assessment & Plan Note (Signed)
Not currently on medications. Does be monitored by PCP.

## 2017-06-15 ENCOUNTER — Telehealth (HOSPITAL_COMMUNITY): Payer: Self-pay

## 2017-06-15 NOTE — Telephone Encounter (Signed)
Encounter complete. 

## 2017-06-17 ENCOUNTER — Ambulatory Visit (HOSPITAL_COMMUNITY)
Admission: RE | Admit: 2017-06-17 | Discharge: 2017-06-17 | Disposition: A | Discharge status: Home / Self Care | Payer: Managed Care, Other (non HMO) | Source: Ambulatory Visit | Attending: Cardiovascular Disease | Admitting: Cardiovascular Disease

## 2017-06-17 DIAGNOSIS — R079 Chest pain, unspecified: Secondary | ICD-10-CM | POA: Diagnosis present

## 2017-06-17 DIAGNOSIS — E785 Hyperlipidemia, unspecified: Secondary | ICD-10-CM | POA: Insufficient documentation

## 2017-06-17 LAB — EXERCISE TOLERANCE TEST
CHL CUP MPHR: 168 {beats}/min
CHL RATE OF PERCEIVED EXERTION: 18
CSEPED: 12 min
Estimated workload: 13.7 METS
Exercise duration (sec): 1 s
Peak HR: 162 {beats}/min
Percent HR: 96 %
Rest HR: 67 {beats}/min

## 2019-12-29 ENCOUNTER — Encounter: Payer: Self-pay | Admitting: Emergency Medicine

## 2019-12-29 ENCOUNTER — Other Ambulatory Visit: Payer: Self-pay

## 2019-12-29 ENCOUNTER — Ambulatory Visit
Admission: EM | Admit: 2019-12-29 | Discharge: 2019-12-29 | Disposition: A | Discharge status: Home / Self Care | Payer: Managed Care, Other (non HMO) | Attending: Emergency Medicine | Admitting: Emergency Medicine

## 2019-12-29 DIAGNOSIS — R059 Cough, unspecified: Secondary | ICD-10-CM

## 2019-12-29 DIAGNOSIS — R05 Cough: Secondary | ICD-10-CM | POA: Diagnosis not present

## 2019-12-29 DIAGNOSIS — J069 Acute upper respiratory infection, unspecified: Secondary | ICD-10-CM | POA: Diagnosis not present

## 2019-12-29 DIAGNOSIS — Z20822 Contact with and (suspected) exposure to covid-19: Secondary | ICD-10-CM | POA: Diagnosis not present

## 2019-12-29 NOTE — ED Triage Notes (Signed)
Pt presents to [facility] for assessment of nasal congestion, scratchy throat, post-nasal drip, cough, bilateral ear fullness.  Patient states he had a flu shot Wednesday.  States starting yesterday he started having achy joints, "congested" cough, and headache begin.  C/o more difficulty getting air in.

## 2019-12-29 NOTE — ED Provider Notes (Signed)
[facility]    CSN: [id number] Arrival date & time: 12/29/19  1134      History   Chief Complaint Chief Complaint  Patient presents with  . URI    HPI Jeffery Branch is a 54 y.o. male with history of allergies, OSA presenting for URI symptoms.  Endorsing nasal congestion, scratchy throat, postnasal drip, nonproductive cough, bilateral ear fullness since Friday.  States he had flu shot on Wednesday: Had arthralgias and headache the next day.  Arthralgias have resolved, and patient has been without fever.  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  Denies chest pain, shortness of breath.  Has been taking OTC cold medications with adequate relief of symptoms.   Past Medical History:  Diagnosis Date  . Allergic rhinitis   . Diverticulosis    with intermittent Diverticulitis; ? IBS history  . High cholesterol    not currently on meds  . Low testosterone in male   . OSA on CPAP     Patient Active Problem List   Diagnosis Date Noted  . Chest pain with low risk for cardiac etiology 05/23/2017  . Dyslipidemia (high LDL; low HDL) 05/23/2017    Past Surgical History:  Procedure Laterality Date  . HAND SURGERY     LEFT  . TONSILLECTOMY         Home Medications    Prior to Admission medications   Medication Sig Start Date End Date Taking? Authorizing Provider  ibuprofen (ADVIL,MOTRIN) 200 MG tablet Take 800 mg by mouth every 6 (six) hours as needed for fever.    [provider]  venlafaxine XR (EFFEXOR-XR) 75 MG 24 hr capsule TAKE ONE CAPSULE BY MOUTH DAILY WITH FOOD 06/16/16   [provider]  VIAGRA 100 MG tablet TAKE 1/2-1 TABLET BY MOUTH AS NEEDED ONCE A DAY 06/28/16   [provider]    Family History Family History  Problem Relation Age of Onset  . Colon polyps Father   . Colon polyps Brother     Social History Social History   Tobacco Use  . Smoking  status: Never Smoker  . Smokeless tobacco: Never Used  Substance Use Topics  . Alcohol use: Yes    Comment: OCCASIONAL  . Drug use: No     Allergies   Penicillins   Review of Systems As per HPI   Physical Exam Triage Vital Signs ED Triage Vitals  Enc Vitals Group     BP      Pulse      Resp      Temp      Temp src      SpO2      Weight      Height      Head Circumference      Peak Flow      Pain Score      Pain Loc      Pain Edu?      Excl. in GC?    No data found.  Updated Vital Signs BP 114/80 (BP Location: Left Arm) Comment: Taken by APP during assessment  Pulse 84   Temp 98.1 F (36.7 C) (Oral)   Resp 16   SpO2 98%   Visual Acuity Right Eye Distance:   Left Eye Distance:   Bilateral Distance:    Right Eye Near:   Left Eye Near:    Bilateral Near:     Physical Exam Constitutional:  General: He is not in acute distress.    Appearance: He is not ill-appearing or diaphoretic.  HENT:     Head: Normocephalic and atraumatic.     Right Ear: Tympanic membrane, ear canal and external ear normal.     Left Ear: Tympanic membrane, ear canal and external ear normal.     Nose: Nose normal. No nasal deformity.     Mouth/Throat:     Mouth: Mucous membranes are moist.     Tongue: Tongue does not deviate from midline.     Pharynx: Oropharynx is clear. Uvula midline.     Comments: No tonsillar hypertrophy or exudate Eyes:     General: No scleral icterus.    Conjunctiva/sclera: Conjunctivae normal.     Pupils: Pupils are equal, round, and reactive to light.  Cardiovascular:     Rate and Rhythm: Normal rate and regular rhythm.     Heart sounds: No murmur. No gallop.   Pulmonary:     Effort: Pulmonary effort is normal. No respiratory distress.     Breath sounds: No wheezing, rhonchi or rales.  Musculoskeletal:        General: Normal range of motion.     Cervical back: Normal range of motion and neck supple. No muscular tenderness.     Right lower  leg: No edema.     Left lower leg: No edema.  Lymphadenopathy:     Cervical: No cervical adenopathy.  Skin:    Capillary Refill: Capillary refill takes less than 2 seconds.  Neurological:     General: No focal deficit present.     Mental Status: He is alert.      UC Treatments / Results  Labs (all labs ordered are listed, but only abnormal results are displayed) Labs Reviewed  NOVEL CORONAVIRUS, NAA - Abnormal; Notable for the following components:      Result Value   SARS-CoV-2, NAA Detected (*)    All other components within normal limits   Narrative:    Performed at:  [address] [place], [place]  [id number] Lab Director: Rush Farmer MD, Phone:  [phone]    EKG   Radiology No results found.  Procedures Procedures (including critical care time)  Medications Ordered in UC Medications - No data to display  Initial Impression / Assessment and Plan / UC Course  I have reviewed the triage vital signs and the nursing notes.  Pertinent labs & imaging results that were available during my care of the patient were reviewed by me and considered in my medical decision making (see chart for details).     Patient afebrile, nontoxic, with SpO2 98%.  Covid PCR pending.  Patient to quarantine until results are back.  We will continue supportive management.  Return precautions discussed, patient verbalized understanding and is agreeable to plan. Final Clinical Impressions(s) / UC Diagnoses   Final diagnoses:  Cough  Viral URI with cough     Discharge Instructions     Your COVID test is pending - it is important to quarantine / isolate at home until your results are back. If you test positive and would like further evaluation for persistent or worsening symptoms, you may schedule an E-visit or virtual (video) visit throughout the [facility] app or website.  PLEASE NOTE: If you develop severe chest pain or shortness of breath please  go to the ER or call 9-1-1 for further evaluation --> DO NOT schedule electronic or virtual visits for this. Please  call our office for further guidance / recommendations as needed.  For information about the Covid vaccine, please visit [email or web address]    ED Prescriptions    None     PDMP not reviewed this encounter.   [place], [place], [place] 12/30/19 1453

## 2019-12-29 NOTE — Discharge Instructions (Signed)
Your COVID test is pending - it is important to quarantine / isolate at home until your results are back. °If you test positive and would like further evaluation for persistent or worsening symptoms, you may schedule an E-visit or virtual (video) visit throughout the [facility] My[facility] app or website. ° °PLEASE NOTE: If you develop severe chest pain or shortness of breath please go to the ER or call 9-1-1 for further evaluation --> DO NOT schedule electronic or virtual visits for this. °Please call our office for further guidance / recommendations as needed. ° °For information about the Covid vaccine, please visit [email or web address] °

## 2019-12-30 ENCOUNTER — Telehealth: Payer: Self-pay | Admitting: Emergency Medicine

## 2019-12-30 LAB — NOVEL CORONAVIRUS, NAA: SARS-CoV-2, NAA: DETECTED — AB

## 2019-12-30 NOTE — Telephone Encounter (Signed)
Patient called to verify results for COVID.  Confirmed identity using two identifiers and reviewed with patient.  Discussed positive result, follow-up and ER precautions reviewed.

## 2022-01-29 ENCOUNTER — Other Ambulatory Visit: Payer: Self-pay | Admitting: Internal Medicine

## 2022-01-29 DIAGNOSIS — R1312 Dysphagia, oropharyngeal phase: Secondary | ICD-10-CM

## 2022-02-05 ENCOUNTER — Ambulatory Visit
Admission: RE | Admit: 2022-02-05 | Discharge: 2022-02-05 | Disposition: A | Discharge status: Home / Self Care | Payer: Managed Care, Other (non HMO) | Source: Ambulatory Visit | Attending: Internal Medicine | Admitting: Internal Medicine

## 2022-02-05 DIAGNOSIS — R1312 Dysphagia, oropharyngeal phase: Secondary | ICD-10-CM

## 2022-10-07 ENCOUNTER — Ambulatory Visit: Payer: Managed Care, Other (non HMO)

## 2022-10-07 ENCOUNTER — Ambulatory Visit
Admission: EM | Admit: 2022-10-07 | Discharge: 2022-10-07 | Disposition: A | Discharge status: Home / Self Care | Payer: Managed Care, Other (non HMO)

## 2022-10-07 DIAGNOSIS — Z8739 Personal history of other diseases of the musculoskeletal system and connective tissue: Secondary | ICD-10-CM

## 2022-10-07 DIAGNOSIS — M79672 Pain in left foot: Secondary | ICD-10-CM | POA: Diagnosis not present

## 2022-10-07 DIAGNOSIS — M79675 Pain in left toe(s): Secondary | ICD-10-CM | POA: Diagnosis not present

## 2022-10-07 MED ORDER — HYDROCODONE-ACETAMINOPHEN 5-325 MG PO TABS: 1.0000 | ORAL_TABLET | Freq: Four times a day (QID) | ORAL | 0 refills | Status: AC | PRN

## 2022-10-07 MED ORDER — METHYLPREDNISOLONE 8 MG PO TABS: ORAL_TABLET | ORAL | 0 refills | Status: AC

## 2022-10-07 MED ORDER — METHYLPREDNISOLONE SODIUM SUCC 125 MG IJ SOLR
125.0000 mg | Freq: Once | INTRAMUSCULAR | Status: AC
  Administered 2022-10-07: 125 mg via INTRAMUSCULAR

## 2022-10-07 NOTE — Discharge Instructions (Addendum)
The x-ray of your foot with did not conclusively show gout or infection of the bone in your foot.  Unfortunately, the radiologist stated it could be one of the other.  I recommend further imaging.  You can either do this by going to the emergency room tonight or speaking with your primary care provider tomorrow.  Because you have a history of gout in the past, I believe that is most appropriate to at least get you comfortable until your appointment tomorrow.  You received an injection of Solu-Medrol during your visit today which I hope has provided you with some relief.  I sent a prescription for the same medication to your pharmacy.  It is a tapering dose.  The prescription states that for the first 5 days you will take 4 tablets by mouth every day with your breakfast meal.  It is not necessary to take this high-dose for full 5 days, only take this high-dose until that you feel your symptoms of pain, swelling and redness are starting to show meaningful signs of improvement.  Once you see this improvement, you can decrease to 4 tablets daily for 5 days, then 3 tablets daily for 5 days, then 2 tablets daily for 5 days, then 1 tablet daily for 5 days.  Please discuss this recommendation with your primary care provider at your annual visit tomorrow.  If your provider recommends different treatment, please defer to their opinion as they know you best.  Thank you for visiting urgent care today.

## 2022-10-07 NOTE — ED Provider Notes (Signed)
[facility]    CSN: [id number] Arrival date & time: 10/07/22  1512    HISTORY   Chief Complaint  Patient presents with   Foot Pain   HPI Jeffery Branch is a pleasant, 56 y.o. male who presents to urgent care today. Patient complains of left foot pain at the base of his great toe for about a week.  Patient states he has a history of gout and has been taking his gout medicine as prescribed which in the past has always helped but states this time he has had no relief.  Patient states the swelling and pain in his foot at this time is a little different from gout but is unable to articulate exactly what is different.  Patient states he is concerned he may have an infection in his foot.  Patient denies fever, body aches.  Patient endorses pain with weightbearing.  Patient has elevated blood pressure on arrival today.  The history is provided by the patient.   Past Medical History:  Diagnosis Date   Allergic rhinitis    Diverticulosis    with intermittent Diverticulitis; ? IBS history   High cholesterol    not currently on meds   Low testosterone in male    OSA on CPAP    Patient Active Problem List   Diagnosis Date Noted   Chest pain with low risk for cardiac etiology 05/23/2017   Dyslipidemia (high LDL; low HDL) 05/23/2017   Past Surgical History:  Procedure Laterality Date   HAND SURGERY     LEFT   TONSILLECTOMY      Home Medications    Prior to Admission medications   Medication Sig Start Date End Date Taking? Authorizing Provider  gabapentin (NEURONTIN) 100 MG capsule Take 100 mg by mouth 3 (three) times daily.   Yes [provider]  ibuprofen (ADVIL,MOTRIN) 200 MG tablet Take 800 mg by mouth every 6 (six) hours as needed for fever.    [provider]  venlafaxine XR (EFFEXOR-XR) 75 MG 24 hr capsule TAKE ONE CAPSULE BY MOUTH DAILY WITH FOOD 06/16/16   [provider]  VIAGRA 100 MG tablet TAKE 1/2-1 TABLET BY MOUTH AS NEEDED ONCE A DAY  06/28/16   [provider]    Family History Family History  Problem Relation Age of Onset   Colon polyps Father    Colon polyps Brother    Social History Social History   Tobacco Use   Smoking status: Never   Smokeless tobacco: Never  Substance Use Topics   Alcohol use: Yes    Comment: OCCASIONAL   Drug use: No   Allergies   Penicillins  Review of Systems Review of Systems Pertinent findings revealed after performing a 14 point review of systems has been noted in the history of present illness.  Physical Exam Triage Vital Signs ED Triage Vitals  Enc Vitals Group     BP 09/25/21 0827 (!) 147/82     Pulse Rate 09/25/21 0827 72     Resp 09/25/21 0827 18     Temp 09/25/21 0827 98.3 F (36.8 C)     Temp Source 09/25/21 0827 Oral     SpO2 09/25/21 0827 98 %     Weight --      Height --      Head Circumference --      Peak Flow --      Pain Score 09/25/21 0826 5     Pain Loc --  Pain Edu? --      Excl. in GC? --   No data found.  Updated Vital Signs BP (!) 148/99 (BP Location: Right Arm)   Pulse 76   Temp 98.1 F (36.7 C) (Oral)   Resp 18   SpO2 98%   Physical Exam Vitals and nursing note reviewed.  Constitutional:      General: He is awake. He is not in acute distress.    Appearance: Normal appearance. He is well-developed and well-groomed. He is obese. He is not ill-appearing, toxic-appearing or diaphoretic.  HENT:     Head: Normocephalic and atraumatic.  Eyes:     Extraocular Movements: Extraocular movements intact.     Conjunctiva/sclera: Conjunctivae normal.     Pupils: Pupils are equal, round, and reactive to light.  Cardiovascular:     Rate and Rhythm: Normal rate and regular rhythm.  Pulmonary:     Effort: Pulmonary effort is normal.     Breath sounds: Normal breath sounds.  Musculoskeletal:        General: Normal range of motion.     Cervical back: Normal range of motion and neck supple.     Comments: Please see photos below   Skin:    General: Skin is warm and dry.  Neurological:     General: No focal deficit present.     Mental Status: He is alert and oriented to person, place, and time. Mental status is at baseline.  Psychiatric:        Attention and Perception: Attention and perception normal.        Mood and Affect: Mood normal.        Speech: Speech normal.        Behavior: Behavior normal. Behavior is cooperative.        Thought Content: Thought content normal.        Cognition and Memory: Cognition and memory normal.        Judgment: Judgment normal.         Visual Acuity Right Eye Distance:   Left Eye Distance:   Bilateral Distance:    Right Eye Near:   Left Eye Near:    Bilateral Near:     [facility] / Diagnostics / Procedures:     Radiology DG Foot Complete Left  Result Date: 10/07/2022 CLINICAL DATA:  Redness swelling base of great toe EXAM: LEFT FOOT - COMPLETE 3+ VIEW COMPARISON:  08/18/2019 FINDINGS: No fracture or malalignment. Soft tissue swelling at the first MTP joint. Mild cortical erosive change at the head of first metatarsal on oblique view. No gas in the soft tissues IMPRESSION: Soft tissue swelling at the first MTP joint with mild cortical erosive change at the head of the first metatarsal. Findings could be secondary to osteomyelitis versus inflammatory arthropathy. Electronically Signed   By: Jasmine Pang M.D.   On: 10/07/2022 17:34    Procedures Procedures (including critical care time) EKG  Pending results:  Labs Reviewed - No data to display  Medications Ordered in UC: Medications  methylPREDNISolone sodium succinate (SOLU-MEDROL) 125 mg/2 mL injection 125 mg (125 mg Intramuscular Given 10/07/22 1704)    UC Diagnoses / Final Clinical Impressions(s)   I have reviewed the triage vital signs and the nursing notes.  Pertinent labs & imaging results that were available during my care of the patient were reviewed by me and considered in my medical decision making  (see chart for details).    Final diagnoses:  Left foot pain  History of gout   Patient advised of ambiguous x-ray findings.  Patient provided with injection of Solu-Medrol with the intention of relieving swelling and pain.  Patient advised to monitor himself for fever, worsening swelling and pain in his foot and to go to the emergency room if either of these occur.  Patient advised to follow-up with his primary care provider to discuss further imaging of his foot to better ascertain whether he is experiencing an acute flare of gout at this time or if there are findings concerning for osteomyelitis.  Patient advised that osteomyelitis cannot be treated with p.o. antibiotics.  Patient provided with a tapering dose of methylprednisolone to begin taking until he receives further instructions from his PCP.  ED Prescriptions     Medication Sig Dispense Auth. Provider   methylPREDNISolone (MEDROL) 8 MG tablet Take 4 tablets (32 mg total) by mouth daily for 5 days, THEN 3 tablets (24 mg total) daily for 5 days, THEN 2 tablets (16 mg total) daily for 5 days, THEN 1 tablet (8 mg total) daily for 5 days. 50 tablet Theadora Rama Scales, PA-C      PDMP not reviewed this encounter.  Pending results:  Labs Reviewed - No data to display  Discharge Instructions:   Discharge Instructions      The x-ray of your foot with did not conclusively show gout or infection of the bone in your foot.  Unfortunately, the radiologist stated it could be one of the other.  I recommend further imaging.  You can either do this by going to the emergency room tonight or speaking with your primary care provider tomorrow.  Because you have a history of gout in the past, I believe that is most appropriate to at least get you comfortable until your appointment tomorrow.  You received an injection of Solu-Medrol during your visit today which I hope has provided you with some relief.  I sent a prescription for the same  medication to your pharmacy.  It is a tapering dose.  The prescription states that for the first 5 days you will take 4 tablets by mouth every day with your breakfast meal.  It is not necessary to take this high-dose for full 5 days, only take this high-dose until that you feel your symptoms of pain, swelling and redness are starting to show meaningful signs of improvement.  Once you see this improvement, you can decrease to 4 tablets daily for 5 days, then 3 tablets daily for 5 days, then 2 tablets daily for 5 days, then 1 tablet daily for 5 days.  Please discuss this recommendation with your primary care provider at your annual visit tomorrow.  If your provider recommends different treatment, please defer to their opinion as they know you best.  Thank you for visiting urgent care today.      Disposition Upon Discharge:  Condition: stable for discharge home  Patient presented with an acute illness with associated systemic symptoms and significant discomfort requiring urgent management. In my opinion, this is a condition that a prudent lay person (someone who possesses an average knowledge of health and medicine) may potentially expect to result in complications if not addressed urgently such as respiratory distress, impairment of bodily function or dysfunction of bodily organs.   Routine symptom specific, illness specific and/or disease specific instructions were discussed with the patient and/or caregiver at length.   As such, the patient has been evaluated and assessed, work-up was performed and treatment was provided in alignment  with urgent care protocols and evidence based medicine.  Patient/parent/caregiver has been advised that the patient may require follow up for further testing and treatment if the symptoms continue in spite of treatment, as clinically indicated and appropriate.  Patient/parent/caregiver has been advised to return to the [facility] or PCP if no better; to PCP or the  Emergency Department if new signs and symptoms develop, or if the current signs or symptoms continue to change or worsen for further workup, evaluation and treatment as clinically indicated and appropriate  The patient will follow up with their current PCP if and as advised. If the patient does not currently have a PCP we will assist them in obtaining one.   The patient may need specialty follow up if the symptoms continue, in spite of conservative treatment and management, for further workup, evaluation, consultation and treatment as clinically indicated and appropriate.   Patient/parent/caregiver verbalized understanding and agreement of plan as discussed.  All questions were addressed during visit.  Please see discharge instructions below for further details of plan.  This office note has been dictated using Teaching laboratory technician.  Unfortunately, this method of dictation can sometimes lead to typographical or grammatical errors.  I apologize for your inconvenience in advance if this occurs.  Please do not hesitate to reach out to me if clarification is needed.      Theadora Rama Scales, [place] 10/09/22 [phone]

## 2022-10-07 NOTE — ED Triage Notes (Signed)
Pt c/o left foot pain for about a week. The patient has swelling to the left foot and redness around the knuckle of left great toe.  Home interventions: ibuprofen, gabapentin

## 2023-08-08 IMAGING — RF DG ESOPHAGUS
10 of 11 series · 14 of 24 positions shown · non-contrast
Comparison: CT abdomen pelvis dated July 17, 2016.

CLINICAL DATA: Oropharyngeal dysphagia.

EXAM:
ESOPHOGRAM / BARIUM SWALLOW / BARIUM TABLET STUDY
TECHNIQUE: Combined double contrast and single contrast examination performed
using effervescent crystals, thick barium liquid, and thin barium
liquid. The patient was observed with fluoroscopy swallowing a 13 mm
barium sulphate tablet.
FLUOROSCOPY:
Radiation Exposure Index (as provided by the fluoroscopic device):
41.88 mGy Kerma

[Series 1: sequence · 2 of 43 frames shown (1 of 9)]
[frame 7/43]
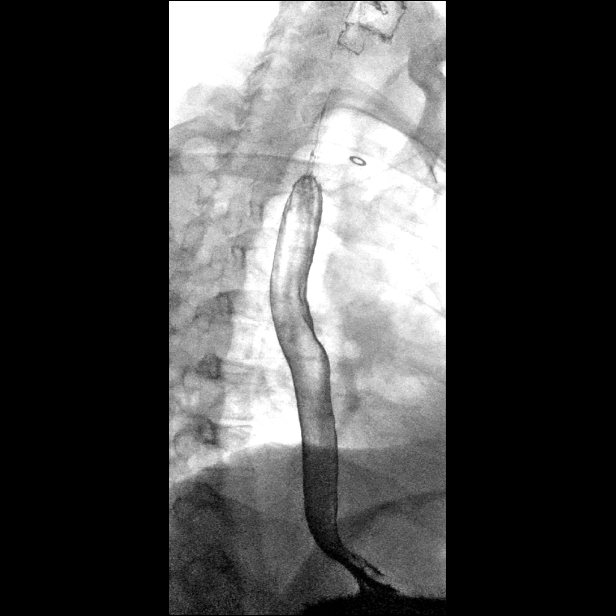
[frame 37/43]
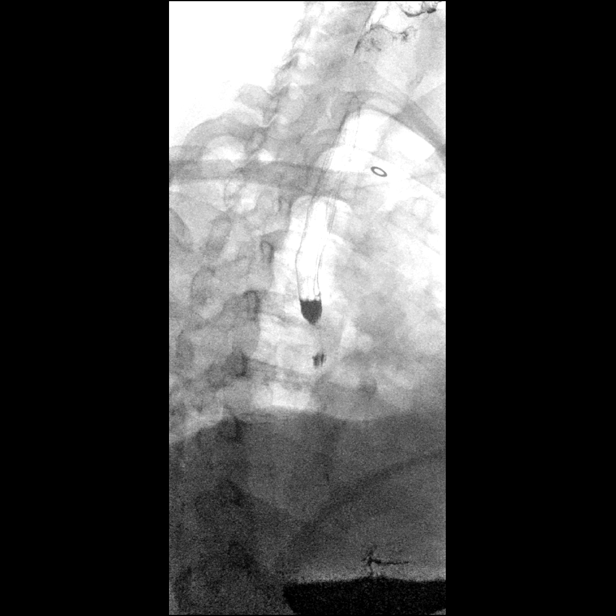

[Series 2: sequence · 1 of 60 frames shown (2 of 9)]
[frame 52/60]
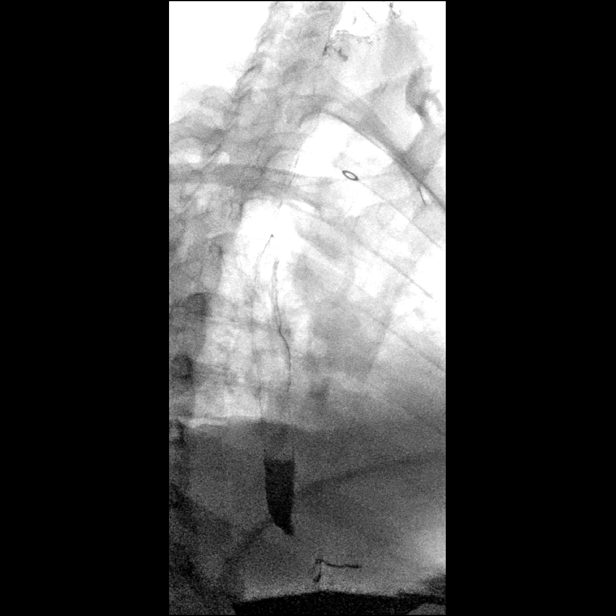

[Series 3: sequence · 1 of 22 frames shown (3 of 9)]
[frame 12/22]
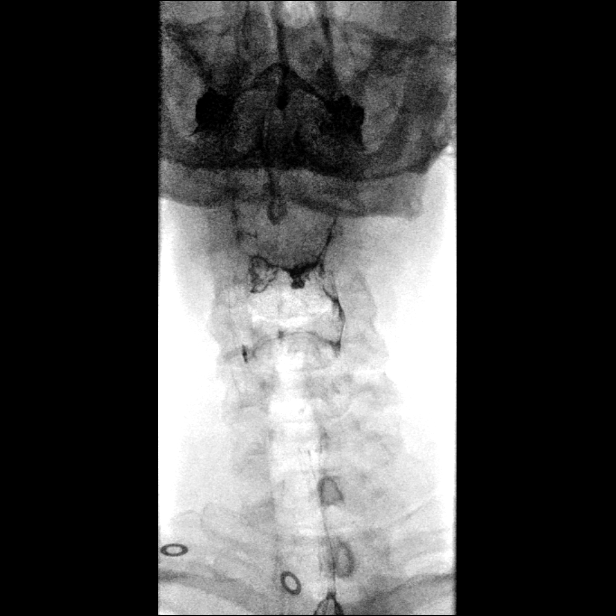

[Series 4: sequence · 2 of 15 frames shown (4 of 9)]
[frame 3/15]
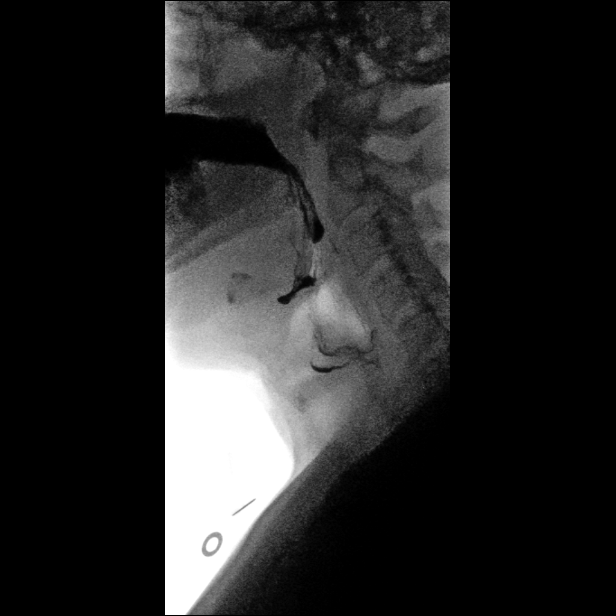
[frame 13/15]
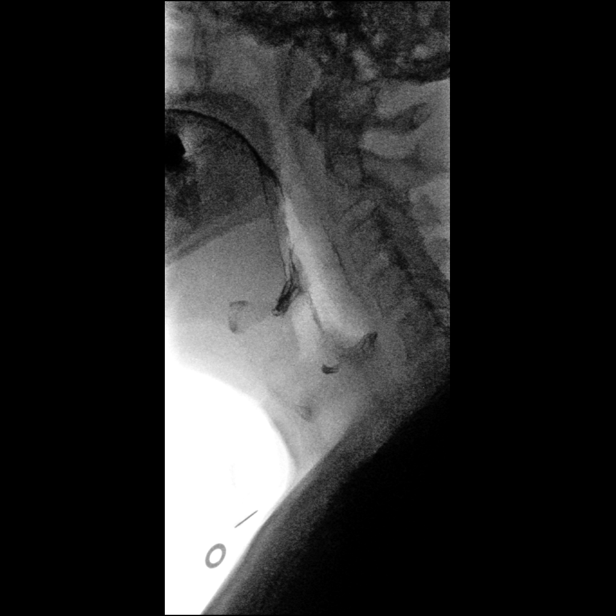

[Series 6: sequence · 2 of 42 frames shown (5 of 9)]
[frame 22/42]
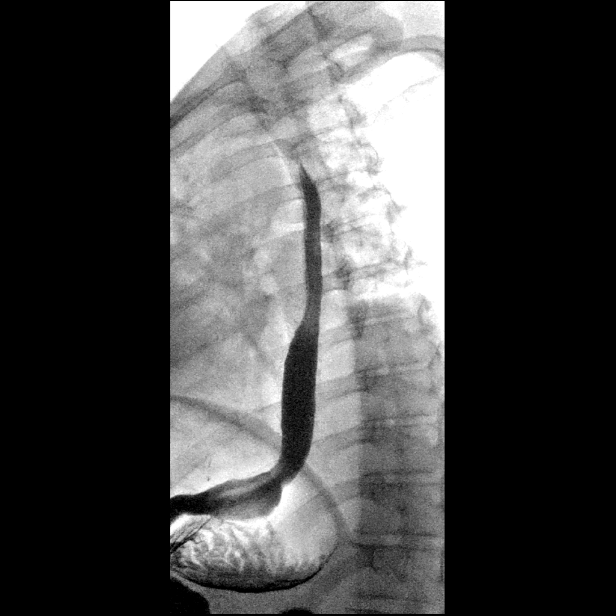
[frame 36/42]
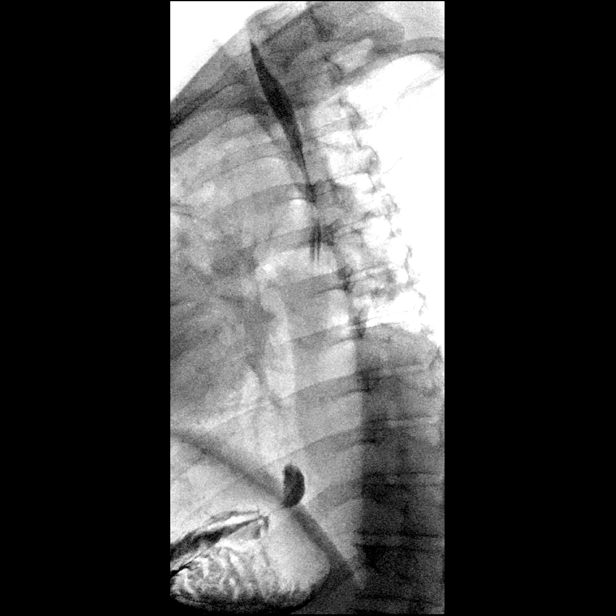

[Series 7: sequence · 1 of 37 frames shown (6 of 9)]
[frame 19/37]
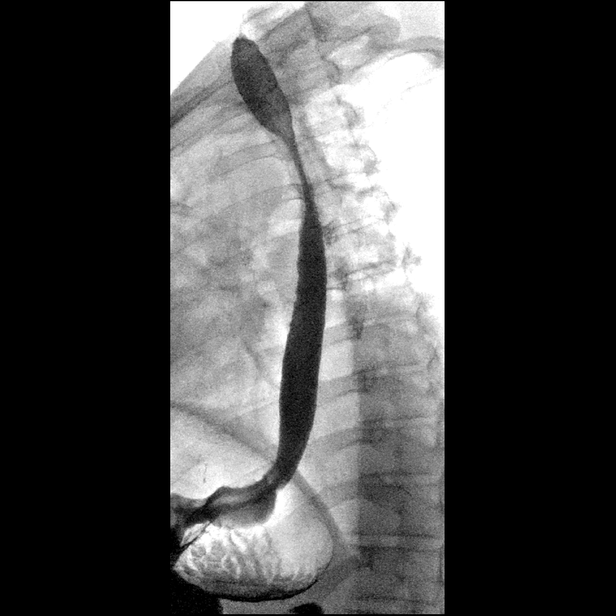

[Series 8: sequence · 1 of 61 frames shown (7 of 9)]
[frame 10/61]
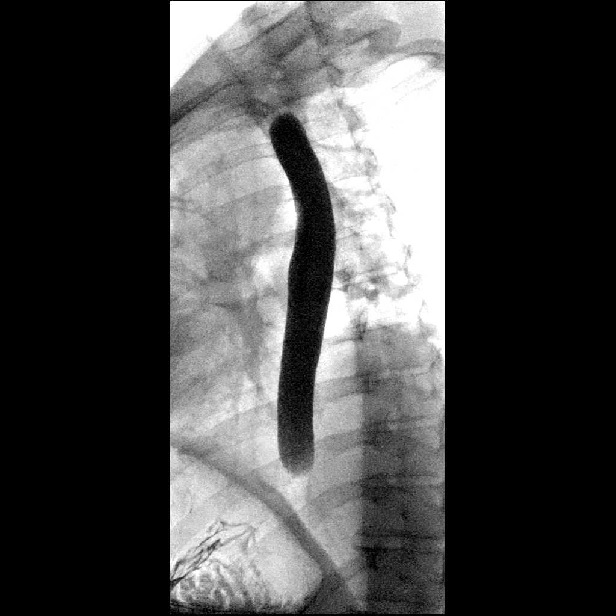

[Series 9: sequence · 2 of 18 frames shown (8 of 9)]
[frame 10/18]
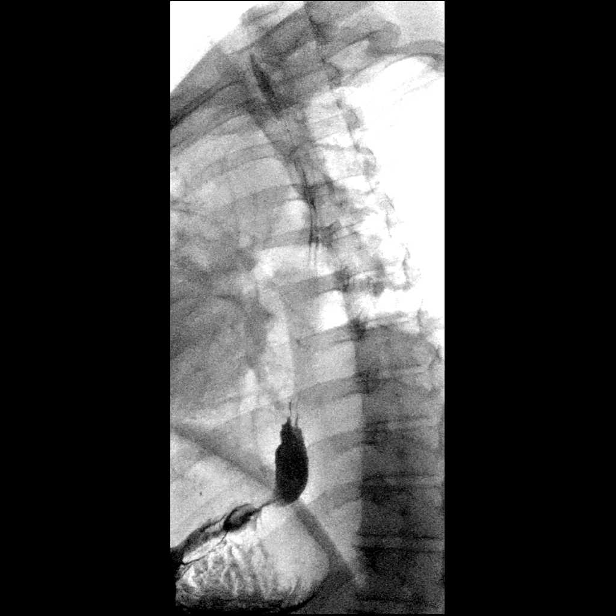
[frame 14/18]
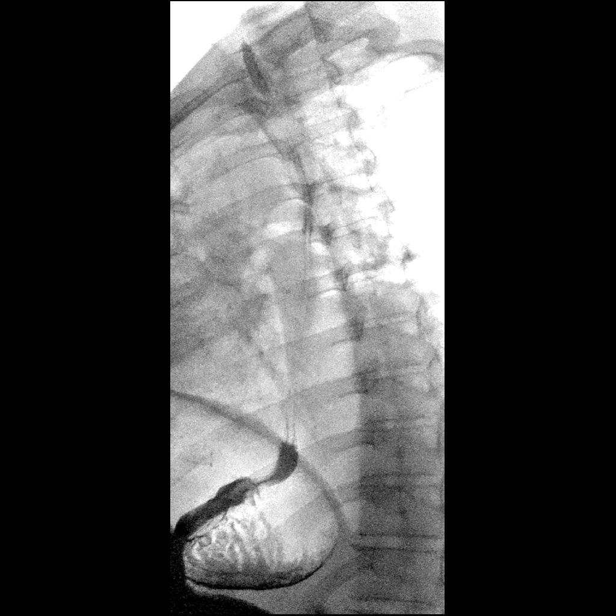

[Series 11: sequence · 1 of 7 frames shown (9 of 9)]
[frame 2/7]
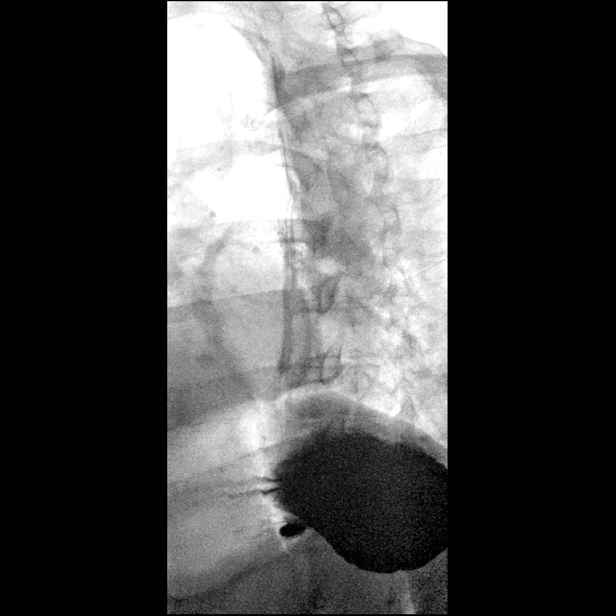

[Series 12: one shot · 1 of 1 slices shown]
[im 1/1]
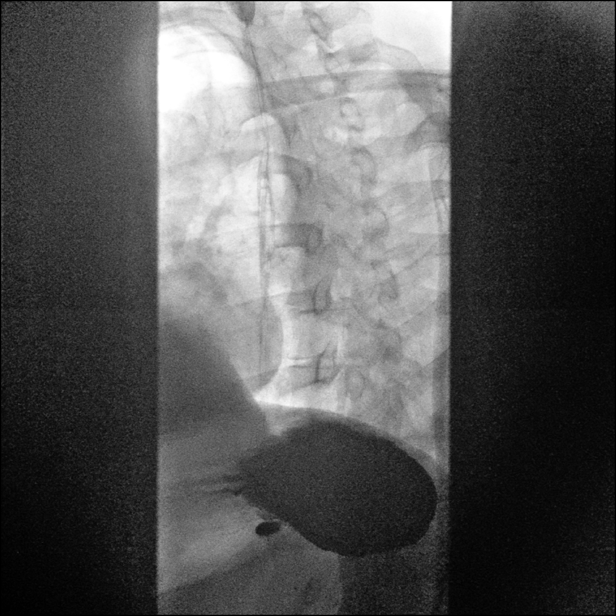

[14 of 24 positions shown; findings below may reference images not displayed]

FINDINGS: Swallowing: Appears normal. No vestibular penetration or aspiration
seen.

Pharynx: Unremarkable.

Esophagus: Normal appearance.

Esophageal motility: Occasional tertiary contractions with
interrupted peristalsis and delayed emptying.

Hiatal Hernia: None.

Gastroesophageal reflux: Minimal spontaneous reflux into the distal
esophagus.

Ingested 13mm barium tablet: Passed normally.

Other: None.
IMPRESSION: 1. Mild esophageal dysmotility.
2. Minimal gastroesophageal reflux.
